# Patient Record
Sex: Female | Born: 1990 | Race: Black or African American | Hispanic: No | Marital: Single | Smoking: Never smoker
Health system: Southern US, Community
[De-identification: ages and names within clinical notes are randomized; demographics above are authoritative.]

---

## 2014-01-03 ENCOUNTER — Encounter (HOSPITAL_COMMUNITY): Payer: Self-pay | Admitting: Emergency Medicine

## 2014-01-03 ENCOUNTER — Emergency Department (HOSPITAL_COMMUNITY)
Admission: EM | Admit: 2014-01-03 | Discharge: 2014-01-03 | Disposition: A | Discharge status: Home / Self Care | Payer: Medicaid Other | Attending: Emergency Medicine | Admitting: Emergency Medicine

## 2014-01-03 DIAGNOSIS — Z3490 Encounter for supervision of normal pregnancy, unspecified, unspecified trimester: Secondary | ICD-10-CM

## 2014-01-03 DIAGNOSIS — Z3400 Encounter for supervision of normal first pregnancy, unspecified trimester: Secondary | ICD-10-CM | POA: Insufficient documentation

## 2014-01-03 DIAGNOSIS — Z79899 Other long term (current) drug therapy: Secondary | ICD-10-CM | POA: Insufficient documentation

## 2014-01-03 DIAGNOSIS — Z0000 Encounter for general adult medical examination without abnormal findings: Secondary | ICD-10-CM

## 2014-01-03 LAB — URINALYSIS, ROUTINE W REFLEX MICROSCOPIC
Bilirubin Urine: NEGATIVE
GLUCOSE, UA: NEGATIVE mg/dL
Hgb urine dipstick: NEGATIVE
Ketones, ur: NEGATIVE mg/dL
NITRITE: NEGATIVE
Protein, ur: NEGATIVE mg/dL
SPECIFIC GRAVITY, URINE: 1.014 (ref 1.005–1.030)
Urobilinogen, UA: 1 mg/dL (ref 0.0–1.0)
pH: 7.5 (ref 5.0–8.0)

## 2014-01-03 LAB — URINE MICROSCOPIC-ADD ON

## 2014-01-03 NOTE — ED Notes (Signed)
Rapid response nurse requested that we wait on gathering a urine sample in order to continue monitoring

## 2014-01-03 NOTE — ED Notes (Signed)
Informed Dr. Rhunette CroftNanavati that RR OB RN stated pt did not need a UA or pelvic exam performed.

## 2014-01-03 NOTE — ED Notes (Signed)
Pt reports she just felt the baby move.

## 2014-01-03 NOTE — ED Notes (Signed)
Rapid Response OB RN at the bedside

## 2014-01-03 NOTE — Progress Notes (Signed)
Dr. Macon LargeAnyanwu notified that pt is 3927 4/[redacted] weeks pregnant with c/o losing her mucous plug and not feeling the baby move. No vaginal bleeding or leaking of fluid. Fhr is reassuring. No uc's. No c/o burning with urination or painful urination. States pt is obstetrically cleared and can be d/c home.

## 2014-01-03 NOTE — Progress Notes (Signed)
Spoke with Dr. Talbot GrumblingNanavti, [facility] MD aqnd told him that Dr. Macon LargeAnyanwu has obsterically cleared the pt and that she can be d/c home.

## 2014-01-03 NOTE — Progress Notes (Signed)
Pt c/o having lost her mucous plug today and not feeling the baby move. Fm placed already by ED staff. Fhr 150bpm. Pt is 27 4/7 pregnant. Denies vaginal bleeding or leaking of fluid.

## 2014-01-03 NOTE — Discharge Instructions (Signed)
Third Trimester of Pregnancy  The third trimester is from week 29 through week 42, months 7 through 9. The third trimester is a time when the fetus is growing rapidly. At the end of the ninth month, the fetus is about 20 inches in length and weighs 6 10 pounds.   BODY CHANGES  Your body goes through many changes during pregnancy. The changes vary from woman to woman.    Your weight will continue to increase. You can expect to gain 25 35 pounds (11 16 kg) by the end of the pregnancy.   You may begin to get stretch marks on your hips, abdomen, and breasts.   You may urinate more often because the fetus is moving lower into your pelvis and pressing on your bladder.   You may develop or continue to have heartburn as a result of your pregnancy.   You may develop constipation because certain hormones are causing the muscles that push waste through your intestines to slow down.   You may develop hemorrhoids or swollen, bulging veins (varicose veins).   You may have pelvic pain because of the weight gain and pregnancy hormones relaxing your joints between the bones in your pelvis. Back aches may result from over exertion of the muscles supporting your posture.   Your breasts will continue to grow and be tender. A yellow discharge may leak from your breasts called colostrum.   Your belly button may stick out.   You may feel short of breath because of your expanding uterus.   You may notice the fetus "dropping," or moving lower in your abdomen.   You may have a bloody mucus discharge. This usually occurs a few days to a week before labor begins.   Your cervix becomes thin and soft (effaced) near your due date.  WHAT TO EXPECT AT YOUR PRENATAL EXAMS   You will have prenatal exams every 2 weeks until week 36. Then, you will have weekly prenatal exams. During a routine prenatal visit:   You will be weighed to make sure you and the fetus are growing normally.   Your blood pressure is taken.   Your abdomen will be  measured to track your baby's growth.   The fetal heartbeat will be listened to.   Any test results from the previous visit will be discussed.   You may have a cervical check near your due date to see if you have effaced.  At around 36 weeks, your caregiver will check your cervix. At the same time, your caregiver will also perform a test on the secretions of the vaginal tissue. This test is to determine if a type of bacteria, Group B streptococcus, is present. Your caregiver will explain this further.  Your caregiver may ask you:   What your birth plan is.   How you are feeling.   If you are feeling the baby move.   If you have had any abnormal symptoms, such as leaking fluid, bleeding, severe headaches, or abdominal cramping.   If you have any questions.  Other tests or screenings that may be performed during your third trimester include:   Blood tests that check for low iron levels (anemia).   Fetal testing to check the health, activity level, and growth of the fetus. Testing is done if you have certain medical conditions or if there are problems during the pregnancy.  FALSE LABOR  You may feel small, irregular contractions that eventually go away. These are called Braxton Hicks contractions, or   false labor. Contractions may last for hours, days, or even weeks before true labor sets in. If contractions come at regular intervals, intensify, or become painful, it is best to be seen by your caregiver.   SIGNS OF LABOR    Menstrual-like cramps.   Contractions that are 5 minutes apart or less.   Contractions that start on the top of the uterus and spread down to the lower abdomen and back.   A sense of increased pelvic pressure or back pain.   A watery or bloody mucus discharge that comes from the vagina.  If you have any of these signs before the 37th week of pregnancy, call your caregiver right away. You need to go to the hospital to get checked immediately.  HOME CARE INSTRUCTIONS    Avoid all  smoking, herbs, alcohol, and unprescribed drugs. These chemicals affect the formation and growth of the baby.   Follow your caregiver's instructions regarding medicine use. There are medicines that are either safe or unsafe to take during pregnancy.   Exercise only as directed by your caregiver. Experiencing uterine cramps is a good sign to stop exercising.   Continue to eat regular, healthy meals.   Wear a good support bra for breast tenderness.   Do not use hot tubs, steam rooms, or saunas.   Wear your seat belt at all times when driving.   Avoid raw meat, uncooked cheese, cat litter boxes, and soil used by cats. These carry germs that can cause birth defects in the baby.   Take your prenatal vitamins.   Try taking a stool softener (if your caregiver approves) if you develop constipation. Eat more high-fiber foods, such as fresh vegetables or fruit and whole grains. Drink plenty of fluids to keep your urine clear or pale yellow.   Take warm sitz baths to soothe any pain or discomfort caused by hemorrhoids. Use hemorrhoid cream if your caregiver approves.   If you develop varicose veins, wear support hose. Elevate your feet for 15 minutes, 3 4 times a day. Limit salt in your diet.   Avoid heavy lifting, wear low heal shoes, and practice good posture.   Rest a lot with your legs elevated if you have leg cramps or low back pain.   Visit your dentist if you have not gone during your pregnancy. Use a soft toothbrush to brush your teeth and be gentle when you floss.   A sexual relationship may be continued unless your caregiver directs you otherwise.   Do not travel far distances unless it is absolutely necessary and only with the approval of your caregiver.   Take prenatal classes to understand, practice, and ask questions about the labor and delivery.   Make a trial run to the hospital.   Pack your hospital bag.   Prepare the baby's nursery.   Continue to go to all your prenatal visits as directed  by your caregiver.  SEEK MEDICAL CARE IF:   You are unsure if you are in labor or if your water has broken.   You have dizziness.   You have mild pelvic cramps, pelvic pressure, or nagging pain in your abdominal area.   You have persistent nausea, vomiting, or diarrhea.   You have a bad smelling vaginal discharge.   You have pain with urination.  SEEK IMMEDIATE MEDICAL CARE IF:    You have a fever.   You are leaking fluid from your vagina.   You have spotting or bleeding from your vagina.     You have severe abdominal cramping or pain.   You have rapid weight loss or gain.   You have shortness of breath with chest pain.   You notice sudden or extreme swelling of your face, hands, ankles, feet, or legs.   You have not felt your baby move in over an hour.   You have severe headaches that do not go away with medicine.   You have vision changes.  Document Released: 09/24/2001 Document Revised: 06/02/2013 Document Reviewed: 12/01/2012  [employer] Patient Information 2014 [employer].

## 2014-01-03 NOTE — ED Notes (Signed)
Pt reports she was using the bathroom with she noticed some "yellow stuff" after wiping, pt is worried it is her mucus plug and that she hasn't felt the baby move since then. Pt denies blood after using the bathroom. Denies abd pain/n/v/d. Pt is [redacted] weeks pregnant. Pt she [facility]. Denies issues with the pregnancy so far. Nad, skin warm and dry, resp e/u.

## 2014-01-03 NOTE — ED Notes (Signed)
Rapid response called.

## 2014-01-03 NOTE — ED Notes (Signed)
RR OB RN no longer at bedside.

## 2014-01-04 NOTE — ED Provider Notes (Signed)
I performed a history and physical examination of  Evelyn Mcgee and discussed her management with the Resident Physician. I agree with the history, physical, assessment, and plan of care, with the following exceptions: None I was present for the following procedures: None  Time Spent in Critical Care of the patient: None  Time spent in discussions with the patient and family: 5-10 minutes  Evelyn Mcgee  3rd trimester patient comes in w/ cc of possible mucus plug dislodgement. G1p0. No abd pai, no uti like sx, no fluid gush, no vaginal bleeding/discharge. OB observation and monitoring shows normal fetal heart tones and reassuring heart rate. Will d.c   Derwood KaplanAnkit Keil Pickering, MD 01/04/14 2250

## 2014-01-04 NOTE — ED Provider Notes (Signed)
CSN: [id number]     Arrival date & time 01/03/14  1648 History   First MD Initiated Contact with Patient 01/03/14 1656     Chief complaint: possible passage of mucus plug  HPI 23 year old female who is [redacted] weeks pregnant presents complaining that she noticed some "yellow stuff" when she wiped after urinating today. She said it was a very small amount. She is worried that he couldn't bend her mucous plug. She did not pass any other fluid. She's not having any abdominal pain, pelvic pain, contractions, vaginal bleeding, vaginal discharge, dysuria, urinary frequency. At first, she felt like the baby was not moving as much as usual. Now she states that she feels vigorous movement. She was checked by an OB at [facility] yesterday and had a normal exam. She denies any other complaints. She's had uneventful pregnancy thus far.   History reviewed. No pertinent past medical history. History reviewed. No pertinent past surgical history. No family history on file. History  Substance Use Topics  . Smoking status: Never Smoker   . Smokeless tobacco: Not on file  . Alcohol Use: No   OB History   Grav Para Term Preterm Abortions TAB SAB Ect Mult Living   1              Review of Systems  Constitutional: Negative for fever, chills and diaphoresis.  HENT: Negative for congestion and rhinorrhea.   Respiratory: Negative for cough, shortness of breath and wheezing.   Cardiovascular: Negative for chest pain and leg swelling.  Gastrointestinal: Negative for nausea, vomiting, abdominal pain and diarrhea.  Genitourinary: Negative for dysuria, urgency, frequency, flank pain, vaginal bleeding, vaginal discharge and difficulty urinating.  Musculoskeletal: Negative for neck pain and neck stiffness.  Skin: Negative for rash.  Neurological: Negative for weakness, numbness and headaches.  All other systems reviewed and are negative.      Allergies  Review of patient's allergies indicates no known  allergies.  Home Medications   Current Outpatient Rx  Name  Route  Sig  Dispense  Refill  . Prenatal Vit-Fe Fumarate-FA (PRENATAL MULTIVITAMIN) TABS tablet   Oral   Take 1 tablet by mouth daily at 12 noon.          BP 106/56  Pulse 90  Temp(Src) 99 F (37.2 C) (Oral)  Resp 20  SpO2 100% Physical Exam  Nursing note and vitals reviewed. Constitutional: She is oriented to person, place, and time. She appears well-developed and well-nourished. No distress.  HENT:  Head: Normocephalic and atraumatic.  Mouth/Throat: Oropharynx is clear and moist.  Eyes: Conjunctivae and EOM are normal. Pupils are equal, round, and reactive to light. No scleral icterus.  Neck: Normal range of motion. Neck supple.  Cardiovascular: Normal rate, regular rhythm, normal heart sounds and intact distal pulses.  Exam reveals no gallop and no friction rub.   No murmur heard. Pulmonary/Chest: Effort normal and breath sounds normal. No respiratory distress. She has no wheezes. She has no rales.  Abdominal: Soft. She exhibits no distension and no mass. There is no tenderness. There is no rebound and no guarding.  Fundus above the umbilicus, nontender. Fetal heart tones detected, he'll heart rate 150s.  Musculoskeletal: She exhibits no edema and no tenderness.  Neurological: She is alert and oriented to person, place, and time. No cranial nerve deficit. She exhibits normal muscle tone. Coordination normal.  Skin: Skin is warm and dry. No rash noted.  Psychiatric: She has a normal mood and affect.  ED Course  Procedures (including critical care time) Labs Review Labs Reviewed  URINALYSIS, ROUTINE W REFLEX MICROSCOPIC - Abnormal; Notable for the following:    Leukocytes, UA TRACE (*)    All other components within normal limits  URINE MICROSCOPIC-ADD ON - Abnormal; Notable for the following:    Squamous Epithelial / LPF FEW (*)    Bacteria, UA FEW (*)    All other components within normal limits    Imaging Review No results found.   EKG Interpretation None      MDM   23 year old female, [redacted] weeks pregnant, there is a small amount of yellow material after she wiped after she urinated. She is worried that it could be her mucous plug. No urinary symptoms. No vaginal bleeding or discharge. No abdominal pain or pelvic pain. No contractions. Baby is moving vigorously. Fetal heart rate 150 and strip reassuring. She has a normal physical exam.  Monitored for a period emergency department. [facility] and they feel patient can be discharged and will followup.  Final diagnoses:  Pregnant  Normal physical exam      Toney SangJerrid Bannon Giammarco, MD 01/04/14 [phone]

## 2014-08-15 ENCOUNTER — Encounter (HOSPITAL_COMMUNITY): Payer: Self-pay | Admitting: Emergency Medicine

## 2014-10-27 ENCOUNTER — Encounter (HOSPITAL_COMMUNITY): Payer: Self-pay | Admitting: Emergency Medicine

## 2014-10-27 DIAGNOSIS — A084 Viral intestinal infection, unspecified: Secondary | ICD-10-CM | POA: Diagnosis not present

## 2014-10-27 DIAGNOSIS — Z79899 Other long term (current) drug therapy: Secondary | ICD-10-CM | POA: Diagnosis not present

## 2014-10-27 DIAGNOSIS — R42 Dizziness and giddiness: Secondary | ICD-10-CM | POA: Insufficient documentation

## 2014-10-27 DIAGNOSIS — R1032 Left lower quadrant pain: Secondary | ICD-10-CM | POA: Diagnosis present

## 2014-10-27 DIAGNOSIS — Z3202 Encounter for pregnancy test, result negative: Secondary | ICD-10-CM | POA: Insufficient documentation

## 2014-10-27 LAB — CBC WITH DIFFERENTIAL/PLATELET
Basophils Absolute: 0 10*3/uL (ref 0.0–0.1)
Basophils Relative: 0 % (ref 0–1)
EOS ABS: 0 10*3/uL (ref 0.0–0.7)
EOS PCT: 1 % (ref 0–5)
HEMATOCRIT: 35.6 % — AB (ref 36.0–46.0)
HEMOGLOBIN: 11.9 g/dL — AB (ref 12.0–15.0)
LYMPHS PCT: 10 % — AB (ref 12–46)
Lymphs Abs: 0.9 10*3/uL (ref 0.7–4.0)
MCH: 29.5 pg (ref 26.0–34.0)
MCHC: 33.4 g/dL (ref 30.0–36.0)
MCV: 88.1 fL (ref 78.0–100.0)
MONOS PCT: 8 % (ref 3–12)
Monocytes Absolute: 0.7 10*3/uL (ref 0.1–1.0)
NEUTROS PCT: 81 % — AB (ref 43–77)
Neutro Abs: 7 10*3/uL (ref 1.7–7.7)
Platelets: 210 10*3/uL (ref 150–400)
RBC: 4.04 MIL/uL (ref 3.87–5.11)
RDW: 13.3 % (ref 11.5–15.5)
WBC: 8.7 10*3/uL (ref 4.0–10.5)

## 2014-10-27 LAB — I-STAT TROPONIN, ED: Troponin i, poc: 0 ng/mL (ref 0.00–0.08)

## 2014-10-27 NOTE — ED Notes (Signed)
The patient said she has had nausea, diarrhea and abdominal pain since tuesday.  The patient feels like she is dehydrated, and weak.  She advised me that she has her period but it has been irregular since she had the mirena put in.  The patient is also complaining of dizziness and wanting to pass out.  She was able to walk to the restroom with some assistance.  She rates her pain 10/10.

## 2014-10-28 ENCOUNTER — Emergency Department (HOSPITAL_COMMUNITY): Payer: Medicaid Other

## 2014-10-28 ENCOUNTER — Emergency Department (HOSPITAL_COMMUNITY)
Admission: EM | Admit: 2014-10-28 | Discharge: 2014-10-28 | Disposition: A | Discharge status: Home / Self Care | Payer: Medicaid Other | Attending: Emergency Medicine | Admitting: Emergency Medicine

## 2014-10-28 ENCOUNTER — Encounter (HOSPITAL_COMMUNITY): Payer: Self-pay

## 2014-10-28 DIAGNOSIS — R1032 Left lower quadrant pain: Secondary | ICD-10-CM

## 2014-10-28 DIAGNOSIS — A084 Viral intestinal infection, unspecified: Secondary | ICD-10-CM

## 2014-10-28 LAB — POC URINE PREG, ED: Preg Test, Ur: NEGATIVE

## 2014-10-28 LAB — COMPREHENSIVE METABOLIC PANEL
ALBUMIN: 3.5 g/dL (ref 3.5–5.2)
ALK PHOS: 48 U/L (ref 39–117)
ALT: 12 U/L (ref 0–35)
AST: 14 U/L (ref 0–37)
Anion gap: 14 (ref 5–15)
BILIRUBIN TOTAL: 0.6 mg/dL (ref 0.3–1.2)
BUN: 8 mg/dL (ref 6–23)
CALCIUM: 9 mg/dL (ref 8.4–10.5)
CO2: 20 mmol/L (ref 19–32)
CREATININE: 0.91 mg/dL (ref 0.50–1.10)
Chloride: 101 mEq/L (ref 96–112)
GFR calc non Af Amer: 90 mL/min — ABNORMAL LOW (ref >90)
Glucose, Bld: 97 mg/dL (ref 70–99)
Potassium: 3.3 mmol/L — ABNORMAL LOW (ref 3.5–5.1)
SODIUM: 135 mmol/L (ref 135–145)
Total Protein: 7.2 g/dL (ref 6.0–8.3)

## 2014-10-28 LAB — URINALYSIS, ROUTINE W REFLEX MICROSCOPIC
Glucose, UA: NEGATIVE mg/dL
Ketones, ur: >80 mg/dL — AB
Nitrite: NEGATIVE
PH: 6 (ref 5.0–8.0)
Protein, ur: 100 mg/dL — AB
Specific Gravity, Urine: 1.035 — ABNORMAL HIGH (ref 1.005–1.030)
Urobilinogen, UA: 1 mg/dL (ref 0.0–1.0)

## 2014-10-28 LAB — URINE MICROSCOPIC-ADD ON

## 2014-10-28 LAB — LIPASE, BLOOD: LIPASE: 20 U/L (ref 11–59)

## 2014-10-28 MED ORDER — ONDANSETRON 4 MG PO TBDP: 4.0000 mg | ORAL_TABLET | Freq: Three times a day (TID) | ORAL | Status: AC | PRN

## 2014-10-28 MED ORDER — IOHEXOL 300 MG/ML  SOLN
25.0000 mL | Freq: Once | INTRAMUSCULAR | Status: AC | PRN
  Administered 2014-10-28: 25 mL via ORAL

## 2014-10-28 MED ORDER — HYDROCODONE-ACETAMINOPHEN 5-325 MG PO TABS: 1.0000 | ORAL_TABLET | ORAL | Status: AC | PRN

## 2014-10-28 MED ORDER — SODIUM CHLORIDE 0.9 % IV BOLUS (SEPSIS)
1000.0000 mL | Freq: Once | INTRAVENOUS | Status: AC
  Administered 2014-10-28: 1000 mL via INTRAVENOUS

## 2014-10-28 MED ORDER — IOHEXOL 300 MG/ML  SOLN
100.0000 mL | Freq: Once | INTRAMUSCULAR | Status: AC | PRN
  Administered 2014-10-28: 100 mL via INTRAVENOUS

## 2014-10-28 MED ORDER — ONDANSETRON HCL 4 MG/2ML IJ SOLN
4.0000 mg | Freq: Once | INTRAMUSCULAR | Status: AC
  Administered 2014-10-28: 4 mg via INTRAVENOUS
  Filled 2014-10-28: qty 2

## 2014-10-28 MED ORDER — MORPHINE SULFATE 4 MG/ML IJ SOLN
4.0000 mg | Freq: Once | INTRAMUSCULAR | Status: AC
  Administered 2014-10-28: 4 mg via INTRAVENOUS
  Filled 2014-10-28: qty 1

## 2014-10-28 NOTE — ED Notes (Signed)
Pt states she has had nausea, vomiting and diarrhea since Tuesday, thinks she may have had food poisoning. No vomiting today, diarrhea x2, pt states she has been drinking fluids today with no issue. Pt c/o lower left quadrant pain and some dizziness that began tonight. Alert oriented, nad.

## 2014-10-28 NOTE — ED Notes (Signed)
Kaitlyn, PA back at the bedside.  

## 2014-10-28 NOTE — ED Provider Notes (Signed)
CSN: [id number]     Arrival date & time 10/27/14  2209 History   First MD Initiated Contact with Patient 10/28/14 0132     Chief Complaint  Patient presents with  . Abdominal Pain    The patient said she has had nausea, diarrhea and abdominal pain since tuesday.  The patient feels like she is dehydrated, and weak.       (Consider location/radiation/quality/duration/timing/severity/associated sxs/prior Treatment) HPI Comments: Patient is a 24 year old female with no past medical history who presents with a 2 day history of abdominal pain. The pain is located left lower abdomen and does not radiate. The pain is described as aching and severe. The pain started gradually and progressively worsened since the onset. No alleviating/aggravating factors. The patient has tried nothing for symptoms without relief. Associated symptoms include NVD. Patient denies fever, headache, chest pain, SOB, dysuria, constipation, abnormal vaginal bleeding/discharge.      History reviewed. No pertinent past medical history. History reviewed. No pertinent past surgical history. History reviewed. No pertinent family history. History  Substance Use Topics  . Smoking status: Never Smoker   . Smokeless tobacco: Not on file  . Alcohol Use: No   OB History    Gravida Para Term Preterm AB TAB SAB Ectopic Multiple Living   1              Review of Systems  Constitutional: Negative for fever, chills and fatigue.  HENT: Negative for trouble swallowing.   Eyes: Negative for visual disturbance.  Respiratory: Negative for shortness of breath.   Cardiovascular: Negative for chest pain and palpitations.  Gastrointestinal: Positive for nausea, vomiting, abdominal pain and diarrhea.  Genitourinary: Negative for dysuria and difficulty urinating.  Musculoskeletal: Negative for arthralgias and neck pain.  Skin: Negative for color change.  Neurological: Positive for light-headedness. Negative for dizziness and weakness.   Psychiatric/Behavioral: Negative for dysphoric mood.      Allergies  Review of patient's allergies indicates no known allergies.  Home Medications   Prior to Admission medications   Medication Sig Start Date End Date Taking? Authorizing Provider  Prenatal Vit-Fe Fumarate-FA (PRENATAL MULTIVITAMIN) TABS tablet Take 1 tablet by mouth daily at 12 noon.    Historical Provider, MD   BP 111/64 mmHg  Pulse 116  Temp(Src) 100.6 F (38.1 C)  Resp 18  SpO2 100%  LMP 10/27/2014  Breastfeeding? Unknown Physical Exam  Constitutional: She is oriented to person, place, and time. She appears well-developed and well-nourished. No distress.  HENT:  Head: Normocephalic and atraumatic.  Eyes: Conjunctivae and EOM are normal.  Neck: Normal range of motion.  Cardiovascular: Normal rate and regular rhythm.  Exam reveals no gallop and no friction rub.   No murmur heard. Pulmonary/Chest: Effort normal and breath sounds normal. She has no wheezes. She has no rales. She exhibits no tenderness.  Abdominal: Soft. She exhibits no distension. There is tenderness. There is no rebound and no guarding.  Left lower quadrant abdominal pain. No other focal tenderness or peritoneal signs.   Musculoskeletal: Normal range of motion.  Neurological: She is alert and oriented to person, place, and time. Coordination normal.  Speech is goal-oriented. Moves limbs without ataxia.   Skin: Skin is warm and dry.  Psychiatric: She has a normal mood and affect. Her behavior is normal.  Nursing note and vitals reviewed.   ED Course  Procedures (including critical care time) Labs Review Labs Reviewed  CBC WITH DIFFERENTIAL - Abnormal; Notable for the following:  Hemoglobin 11.9 (*)    HCT 35.6 (*)    Neutrophils Relative % 81 (*)    Lymphocytes Relative 10 (*)    All other components within normal limits  COMPREHENSIVE METABOLIC PANEL - Abnormal; Notable for the following:    Potassium 3.3 (*)    GFR calc non Af  Amer 90 (*)    All other components within normal limits  URINALYSIS, ROUTINE W REFLEX MICROSCOPIC - Abnormal; Notable for the following:    Color, Urine AMBER (*)    Specific Gravity, Urine 1.035 (*)    Hgb urine dipstick LARGE (*)    Bilirubin Urine MODERATE (*)    Ketones, ur >80 (*)    Protein, ur 100 (*)    Leukocytes, UA MODERATE (*)    All other components within normal limits  URINE MICROSCOPIC-ADD ON - Abnormal; Notable for the following:    Squamous Epithelial / LPF FEW (*)    Bacteria, UA FEW (*)    Casts GRANULAR CAST (*)    All other components within normal limits  LIPASE, BLOOD  I-STAT TROPOININ, ED  POC URINE PREG, ED    Imaging Review Ct Abdomen Pelvis W Contrast  10/28/2014   CLINICAL DATA:  Acute onset of generalized abdominal pain, eventually localizing to the left lower quadrant, with diarrhea and fever. Initial encounter.  EXAM: CT ABDOMEN AND PELVIS WITH CONTRAST  TECHNIQUE: Multidetector CT imaging of the abdomen and pelvis was performed using the standard protocol following bolus administration of intravenous contrast.  CONTRAST:  100mL OMNIPAQUE IOHEXOL 300 MG/ML  SOLN  COMPARISON:  None.  FINDINGS: Minimal left basilar atelectasis is noted.  The liver and spleen are unremarkable in appearance. The gallbladder is within normal limits. The pancreas and adrenal glands are unremarkable.  The kidneys are unremarkable in appearance. There is no evidence of hydronephrosis. No renal or ureteral stones are seen. No perinephric stranding is appreciated.  The small bowel is unremarkable in appearance. The stomach is within normal limits. No acute vascular abnormalities are seen.  Mildly prominent mesenteric nodes are seen.  The appendix remains borderline normal in caliber, without evidence for appendicitis.  There is diffuse mucosal edema and mild wall thickening involving the entirety of the colon, with mild sparing of the distal descending and proximal sigmoid colon. Diffuse  associated soft tissue inflammation is seen, with trace associated free fluid in the pelvis. Findings are compatible with acute colitis, either infectious or inflammatory in nature. There is no definite evidence of ischemia.  The bladder is mildly distended and grossly unremarkable. The uterus is unremarkable in appearance, with an anterior device noted in expected position at the fundus of the uterus. There is a large 5.8 cm cystic lesion at the right adnexa. The left ovary is grossly unremarkable. No inguinal lymphadenopathy is seen.  No acute osseous abnormalities are identified.  IMPRESSION: 1. Diffuse mucosal edema and mild wall thickening involving the entirety of colon, with mild sparing of the distal descending and proximal sigmoid colon. Diffuse associated soft tissue inflammation, with trace associated free fluid in the pelvis. Findings compatible with acute colitis, either infectious or inflammatory in nature. 2. Mildly prominent mesenteric nodes noted. 3. Large 5.8 cm right adnexal cystic lesion noted. Pelvic ultrasound could be considered for further evaluation, on an elective nonemergent basis.   Electronically Signed   By: Roanna RaiderJeffery  Chang M.D.   On: 10/28/2014 05:24     EKG Interpretation None      MDM   Final  diagnoses:  LLQ abdominal pain  Viral colitis    1:33 AM Patient is febrile and tachycardic. Labs unremarkable for acute changes. Urinalysis pending.   5:42 AM Urinalysis shows no UTI. Patient's CT shows colitis. This is likely viral and patient will be discharged with pain medication and nausea medication. Patient instructed to return with worsening or concerning symptoms. Patient instructed to drink plenty of fluids.    Emilia Beck, PA-C 10/28/14 0547  Ward Givens, MD 10/28/14 0600

## 2014-10-28 NOTE — Discharge Instructions (Signed)
Take zofran as needed for nausea. Take vicodin as needed for pain. Refer to attached documents for more information. Return to the ED with worsening or concerning symptoms.

## 2015-07-30 ENCOUNTER — Emergency Department (INDEPENDENT_AMBULATORY_CARE_PROVIDER_SITE_OTHER)
Admission: EM | Admit: 2015-07-30 | Discharge: 2015-07-30 | Disposition: A | Discharge status: Home / Self Care | Payer: Self-pay | Source: Home / Self Care | Attending: Family Medicine | Admitting: Family Medicine

## 2015-07-30 ENCOUNTER — Encounter (HOSPITAL_COMMUNITY): Payer: Self-pay | Admitting: *Deleted

## 2015-07-30 DIAGNOSIS — L03116 Cellulitis of left lower limb: Secondary | ICD-10-CM

## 2015-07-30 MED ORDER — DOXYCYCLINE HYCLATE 100 MG PO CAPS: 100.0000 mg | ORAL_CAPSULE | Freq: Two times a day (BID) | ORAL | Status: AC

## 2015-07-30 NOTE — ED Provider Notes (Signed)
CSN: [id number]     Arrival date & time 07/30/15  1355 History   First MD Initiated Contact with Patient 07/30/15 1457     Chief Complaint  Patient presents with  . Rash   (Consider location/radiation/quality/duration/timing/severity/associated sxs/prior Treatment) Patient is a 24 y.o. female presenting with rash. The history is provided by the patient.  Rash Location:  Leg Leg rash location:  L hip Severity:  Mild Onset quality:  Gradual Duration:  5 days Timing:  Constant Progression:  Worsening Chronicity:  New Relieved by:  Nothing Ineffective treatments:  None tried  this begins a single pustule on the left hip and has now developed into a cluster in an annular distribution.  History reviewed. No pertinent past medical history. History reviewed. No pertinent past surgical history. No family history on file. Social History  Substance Use Topics  . Smoking status: Never Smoker   . Smokeless tobacco: None  . Alcohol Use: No   OB History    Gravida Para Term Preterm AB TAB SAB Ectopic Multiple Living   1              Review of Systems  Constitutional: Negative.   HENT: Negative.   Eyes: Negative.   Respiratory: Negative.   Cardiovascular: Negative.   Skin: Positive for rash.    Allergies  Review of patient's allergies indicates no known allergies.  Home Medications   Prior to Admission medications   Medication Sig Start Date End Date Taking? Authorizing Provider  HYDROcodone-acetaminophen (NORCO/VICODIN) 5-325 MG per tablet Take 1-2 tablets by mouth every 4 (four) hours as needed for moderate pain or severe pain. 10/28/14   Kaitlyn Szekalski, PA-C  ondansetron (ZOFRAN ODT) 4 MG disintegrating tablet Take 1 tablet (4 mg total) by mouth every 8 (eight) hours as needed for nausea or vomiting. 10/28/14   Emilia BeckKaitlyn Szekalski, PA-C   Meds Ordered and Administered this Visit  Medications - No data to display  BP 112/71 mmHg  Pulse 77  Temp(Src) 98.2 F (36.8 C)  (Oral)  Resp 18  SpO2 100%  LMP 07/27/2015 (Exact Date) No data found.   Physical Exam  Constitutional: She appears well-developed and well-nourished.  HENT:  Head: Normocephalic and atraumatic.  Eyes: Conjunctivae are normal. Pupils are equal, round, and reactive to light.  Neck: Normal range of motion. Neck supple.  Cardiovascular: Normal rate.   Pulmonary/Chest: Effort normal.  Skin:  Cluster about 10 pustules on the left hip with mild surrounding erythema, no induration or fluctuance  Nursing note and vitals reviewed.   ED Course  Procedures (including critical care time)   MDM       ICD-9-CM ICD-10-CM   1. Cellulitis of left lower extremity 682.6 L03.116 doxycycline (VIBRAMYCIN) 100 MG capsule     Signed, Elvina SidleKurt Jo Booze, MD     Elvina SidleKurt Eusevio Schriver, MD 07/30/15 [phone]

## 2015-07-30 NOTE — ED Notes (Signed)
Started with small bump to left proximal lateral thigh 3 days ago.  Now has circular area of swelling and redness with multiple confined small vesicles.  Denies pruritis, but c/o pain.  Denies fevers.

## 2015-07-30 NOTE — Discharge Instructions (Signed)

## 2016-02-05 IMAGING — CT CT ABD-PELV W/ CM
2 of 4 series · 15 of 46 positions shown, 17 images · IV contrast (omnipaque)
Comparison: None.

CLINICAL DATA: Acute onset of generalized abdominal pain,
eventually localizing to the left lower quadrant, with diarrhea and
fever. Initial encounter.

EXAM:
CT ABDOMEN AND PELVIS WITH CONTRAST
TECHNIQUE: Multidetector CT imaging of the abdomen and pelvis was performed
using the standard protocol following bolus administration of
intravenous contrast.
CONTRAST:  100mL OMNIPAQUE IOHEXOL 300 MG/ML  SOLN

[Series 2: abd/ pelvis 5.0 i30f 1 · axial · 0.63mm/px · z∈[-473,-53]mm · 12 of 92 slices shown, 14 images]
[im 4/92  soft-tissue]
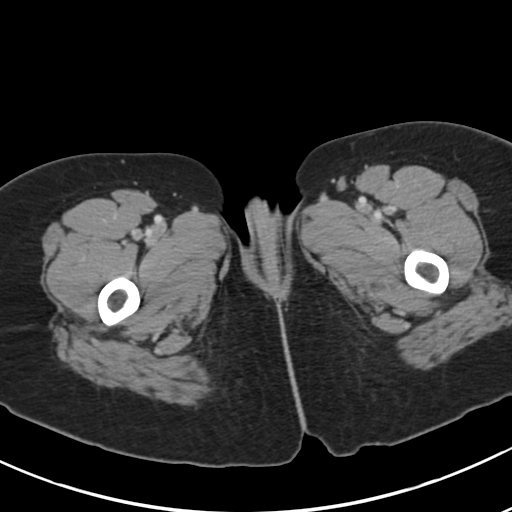
[im 4/92  bone]
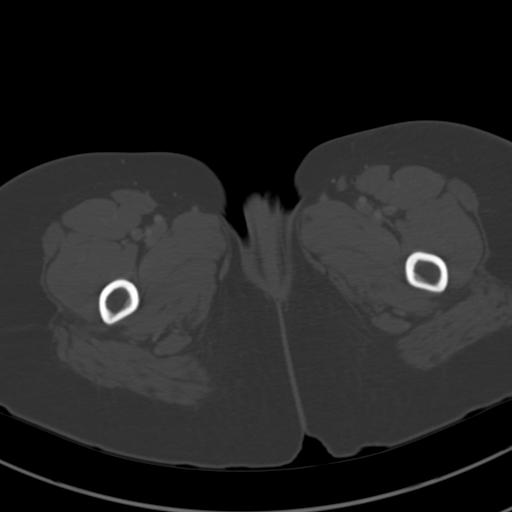
[im 12/92  soft-tissue]
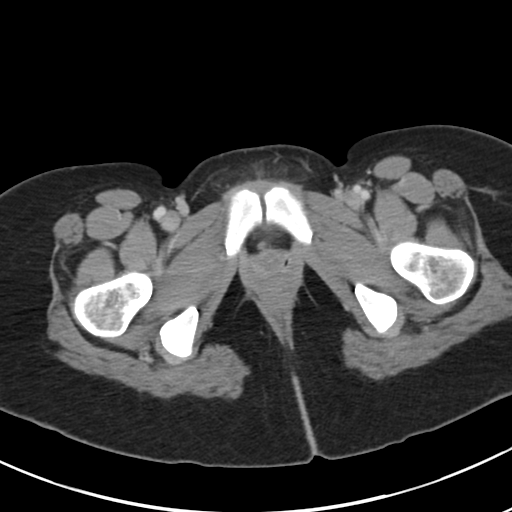
[im 19/92  soft-tissue]
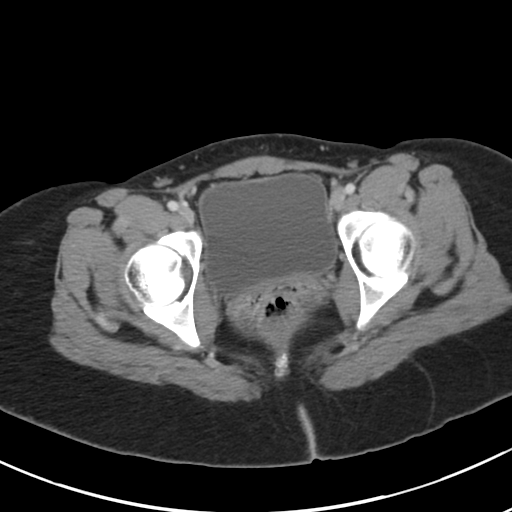
[im 27/92  soft-tissue]
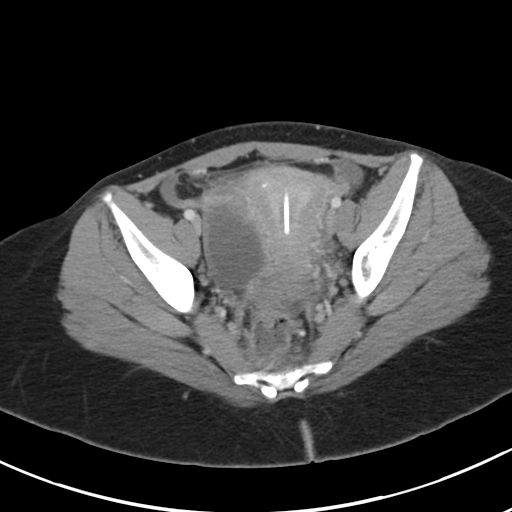
[im 35/92  soft-tissue]
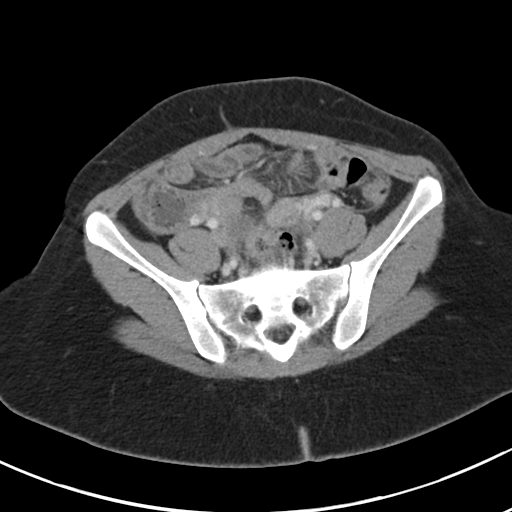
[im 42/92  soft-tissue]
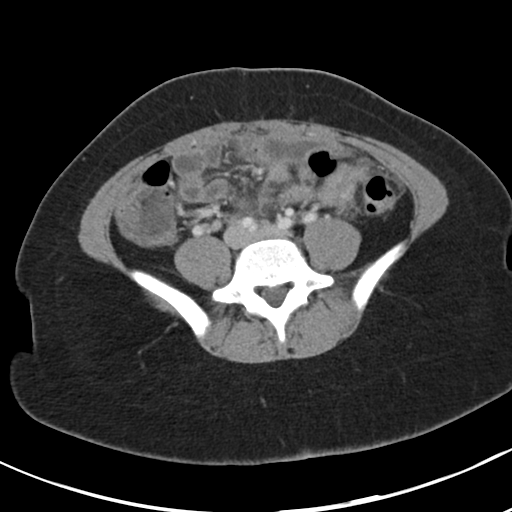
[im 50/92  soft-tissue]
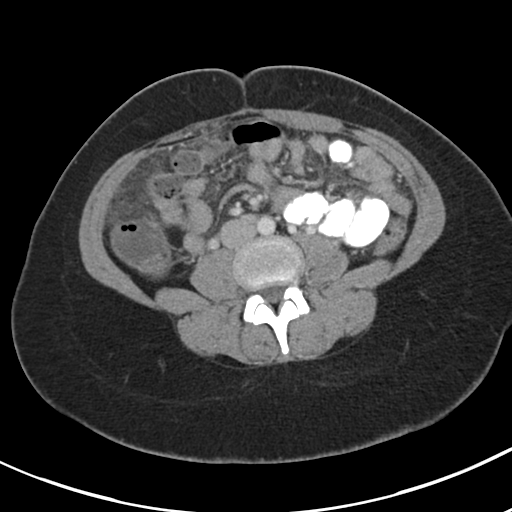
[im 57/92  soft-tissue]
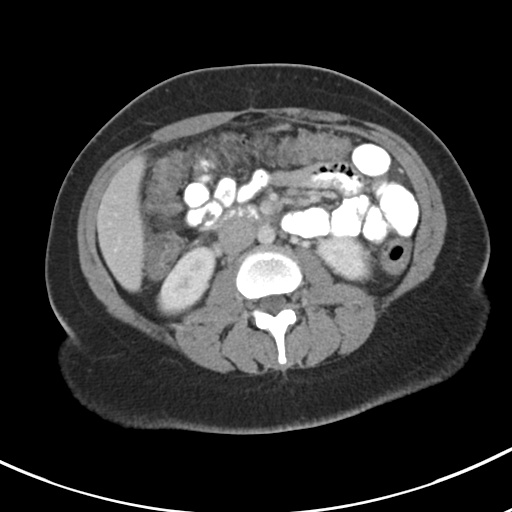
[im 65/92  soft-tissue]
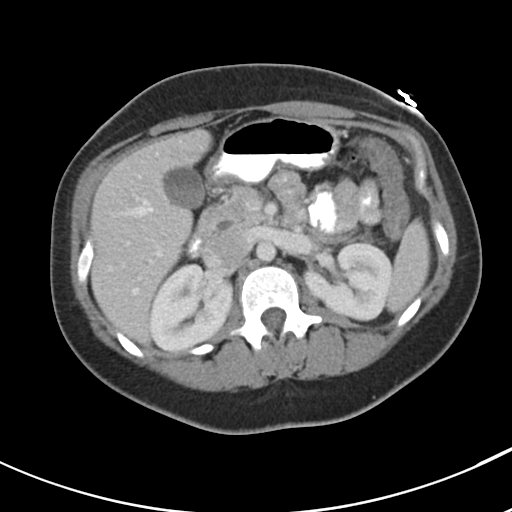
[im 65/92  bone]
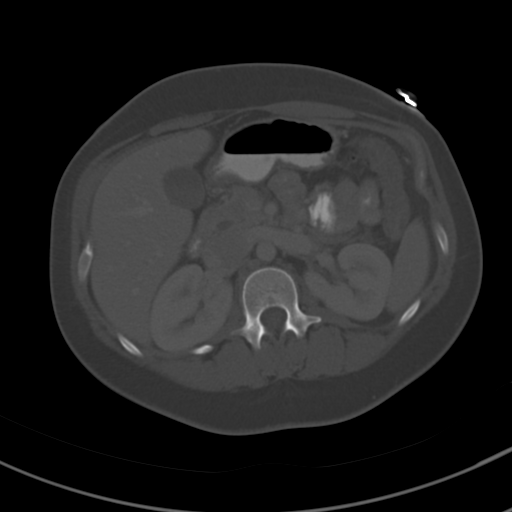
[im 73/92  soft-tissue]
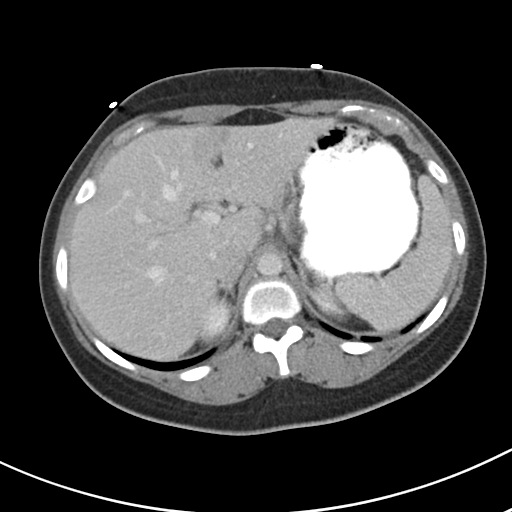
[im 80/92  soft-tissue]
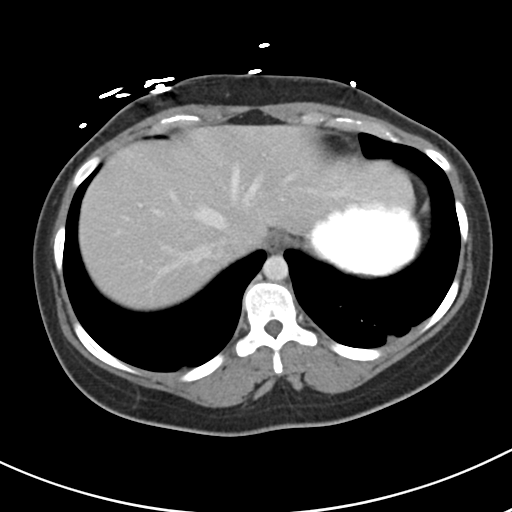
[im 88/92  soft-tissue]
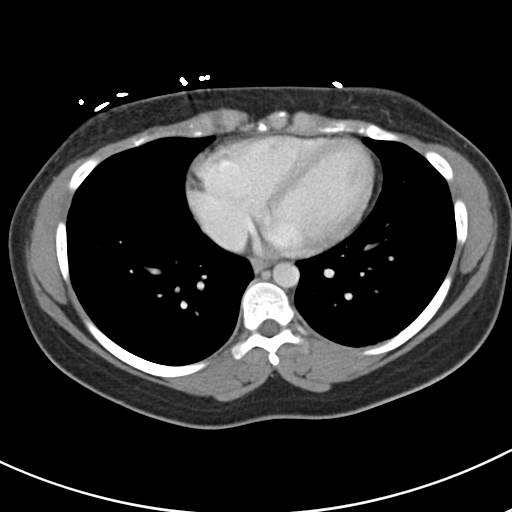

[Series 5: coronals · coronal · 0.74mm/px · 3 of 120 slices shown]
[im 40/120  soft-tissue]
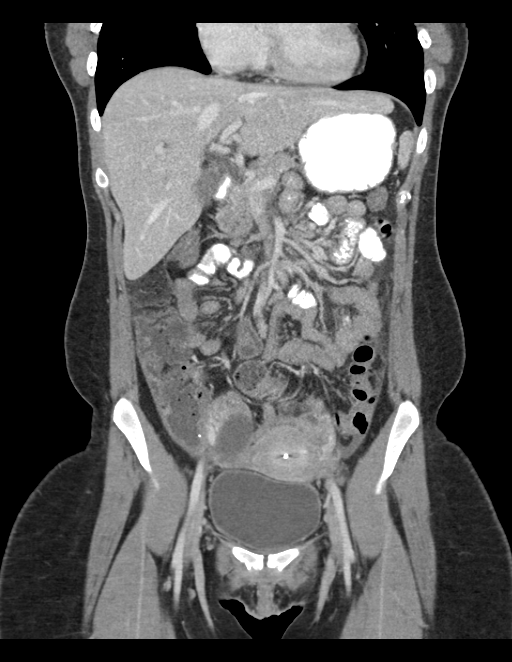
[im 53/120  soft-tissue]
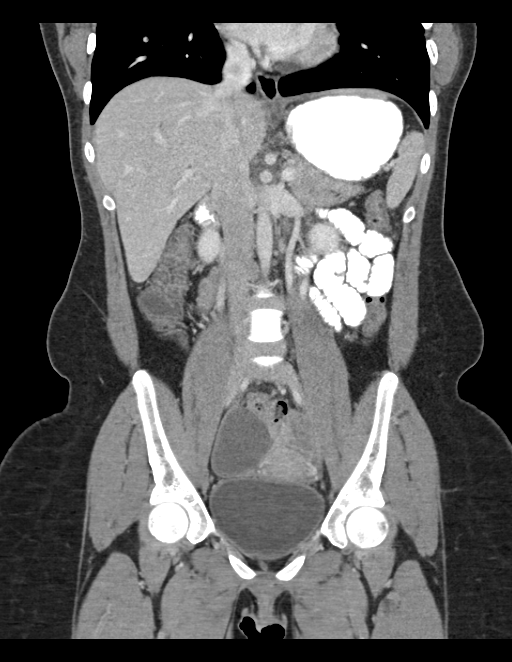
[im 67/120  soft-tissue]
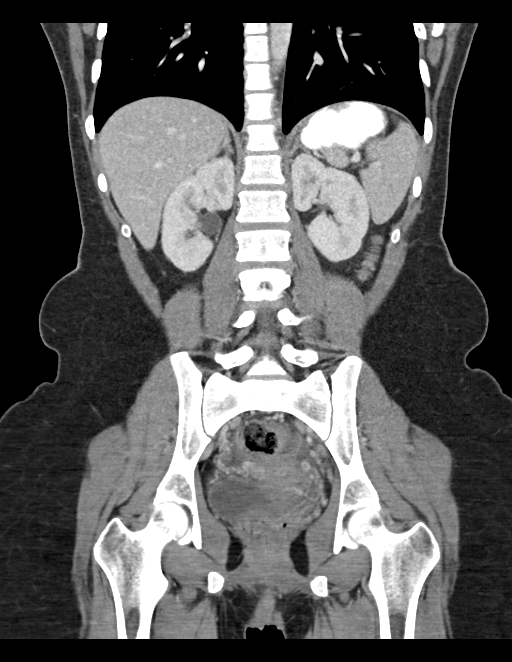

[15 of 46 positions shown; findings below may reference images not displayed]

FINDINGS: Minimal left basilar atelectasis is noted.

The liver and spleen are unremarkable in appearance. The gallbladder
is within normal limits. The pancreas and adrenal glands are
unremarkable.

The kidneys are unremarkable in appearance. There is no evidence of
hydronephrosis. No renal or ureteral stones are seen. No perinephric
stranding is appreciated.

The small bowel is unremarkable in appearance. The stomach is within
normal limits. No acute vascular abnormalities are seen.

Mildly prominent mesenteric nodes are seen.

The appendix remains borderline normal in caliber, without evidence
for appendicitis.

There is diffuse mucosal edema and mild wall thickening involving
the entirety of the colon, with mild sparing of the distal
descending and proximal sigmoid colon. Diffuse associated soft
tissue inflammation is seen, with trace associated free fluid in the
pelvis. Findings are compatible with acute colitis, either
infectious or inflammatory in nature. There is no definite evidence
of ischemia.

The bladder is mildly distended and grossly unremarkable. The uterus
is unremarkable in appearance, with an anterior device noted in
expected position at the fundus of the uterus. There is a large
cm cystic lesion at the right adnexa. The left ovary is grossly
unremarkable. No inguinal lymphadenopathy is seen.

No acute osseous abnormalities are identified.
IMPRESSION: 1. Diffuse mucosal edema and mild wall thickening involving the
entirety of colon, with mild sparing of the distal descending and
proximal sigmoid colon. Diffuse associated soft tissue inflammation,
with trace associated free fluid in the pelvis. Findings compatible
with acute colitis, either infectious or inflammatory in nature.
2. Mildly prominent mesenteric nodes noted.
3. Large 5.8 cm right adnexal cystic lesion noted. Pelvic ultrasound
could be considered for further evaluation, on an elective
nonemergent basis.
